# Patient Record
Sex: Female | Born: 1995 | Race: White | Hispanic: No | Marital: Single | Smoking: Never smoker
Health system: Southern US, Community
[De-identification: ages and names within clinical notes are randomized; demographics above are authoritative.]

---
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
## 2013-02-15 ENCOUNTER — Encounter (INDEPENDENT_AMBULATORY_CARE_PROVIDER_SITE_OTHER): Payer: Self-pay

## 2013-02-15 ENCOUNTER — Ambulatory Visit (INDEPENDENT_AMBULATORY_CARE_PROVIDER_SITE_OTHER): Payer: BC Managed Care – PPO

## 2013-02-15 DIAGNOSIS — Z23 Encounter for immunization: Secondary | ICD-10-CM

## 2014-01-05 ENCOUNTER — Ambulatory Visit (INDEPENDENT_AMBULATORY_CARE_PROVIDER_SITE_OTHER): Payer: BC Managed Care – PPO | Admitting: Family

## 2014-01-05 VITALS — BP 109/73 | HR 111 | Temp 98.8°F | Wt 114.2 lb

## 2014-01-05 DIAGNOSIS — L259 Unspecified contact dermatitis, unspecified cause: Secondary | ICD-10-CM

## 2014-01-05 NOTE — Progress Notes (Signed)
   Subjective:    Patient ID: Joan Payne, female    DOB: [birth year], 18 y.o.   MRN: [id number]  Rash This is a new problem. The current episode started today. The problem has been rapidly improving since onset. The affected locations include the left upper leg. The rash is characterized by redness. She was exposed to nothing. Pertinent negatives include no congestion, diarrhea, eye pain, fever, rhinorrhea, shortness of breath or sore throat. Past treatments include anti-itch cream. The treatment provided moderate relief.      Review of Systems  Constitutional: Negative.  Negative for fever.  HENT: Negative.  Negative for congestion, rhinorrhea and sore throat.   Eyes: Negative.  Negative for pain.  Respiratory: Negative.  Negative for shortness of breath.   Cardiovascular: Negative.  Negative for palpitations.  Gastrointestinal: Negative.  Negative for diarrhea.  Endocrine: Negative.   Genitourinary: Negative.   Musculoskeletal: Negative.   Skin: Positive for rash.  Neurological: Negative.  Negative for headaches.  Hematological: Negative.   Psychiatric/Behavioral: Negative.   All other systems reviewed and are negative.      Objective:   Physical Exam  Vitals reviewed. Constitutional: She is oriented to person, place, and time. She appears well-developed and well-nourished. No distress.  Eyes: Pupils are equal, round, and reactive to light.  Neck: Normal range of motion. Neck supple. No thyromegaly present.  Cardiovascular: Normal rate, regular rhythm, normal heart sounds and intact distal pulses.   No murmur heard. Pulmonary/Chest: Effort normal and breath sounds normal. No respiratory distress. She has no wheezes.  Abdominal: Soft. Bowel sounds are normal. She exhibits no distension. There is no tenderness.  Musculoskeletal: Normal range of motion. She exhibits no edema and no tenderness.  Neurological: She is alert and oriented to person, place, and time. She has normal  reflexes. No cranial nerve deficit.  Skin: Skin is warm and dry. Rash noted.  Three different abrasions on upper left leg  Psychiatric: She has a normal mood and affect. Her behavior is normal. Judgment and thought content normal.    BP 109/73  Pulse 111  Temp(Src) 98.8 F (37.1 C) (Oral)  Wt 114 lb 3.2 oz (51.801 kg)       Assessment & Plan:  1. Contact dermatitis -Continue to watch if symptoms  worsen told pt to call and would call in RX for kenalog cream -Do not scratch area -Keep clean and dry  Evelina Dun, FNP

## 2014-01-05 NOTE — Patient Instructions (Signed)

## 2014-02-06 ENCOUNTER — Ambulatory Visit: Payer: BC Managed Care – PPO

## 2014-02-28 ENCOUNTER — Ambulatory Visit: Payer: BC Managed Care – PPO

## 2014-08-07 ENCOUNTER — Encounter: Payer: Self-pay | Admitting: Physician Assistant

## 2014-08-07 ENCOUNTER — Ambulatory Visit (INDEPENDENT_AMBULATORY_CARE_PROVIDER_SITE_OTHER): Payer: BC Managed Care – PPO | Admitting: Physician Assistant

## 2014-08-07 VITALS — BP 117/79 | HR 83 | Temp 98.3°F | Ht 67.0 in | Wt 112.0 lb

## 2014-08-07 DIAGNOSIS — B079 Viral wart, unspecified: Secondary | ICD-10-CM | POA: Diagnosis not present

## 2014-08-07 NOTE — Progress Notes (Signed)
   Subjective:    Patient ID: Joan Payne, female    DOB: 1996/03/06, 19 y.o.   MRN: [id number]  HPI 19 y/o female presents for wart removal on LLE, buttock area.   Review of Systems  Skin:       Wart on LLE       Objective:   Physical Exam  Constitutional: She is oriented to person, place, and time.  Neurological: She is alert and oriented to person, place, and time.  Skin:  Hyperkeratotic flesh colored lesion on lower left buttock, LLE area. Resembling wart. 1cm x .5cm           Assessment & Plan:  1. Wart: biopsy to r/o dysplasia via wider shave excision. Hyfrecation applied post removal. Bandage applied. Advised patient to use vaseline for healing. F/U prn

## 2014-08-09 ENCOUNTER — Telehealth: Payer: Self-pay | Admitting: Physician Assistant

## 2014-08-09 NOTE — Telephone Encounter (Signed)
I strongly advised against tanning d/t effects on skin. However, no restrictions to tanning based on removal and is up to her discretion

## 2014-08-09 NOTE — Telephone Encounter (Signed)
Discussed with Marline Backbone, PA-C. Explained to patient that Tiffany can't advise her to use a tanning bed at all. I did suggest that if she insisted then she should wait until the area is completely healed.

## 2014-12-12 ENCOUNTER — Telehealth: Payer: Self-pay | Admitting: Family

## 2014-12-12 NOTE — Telephone Encounter (Signed)
Patient could benefit from second Menningicoccal injection.

## 2014-12-18 ENCOUNTER — Encounter (INDEPENDENT_AMBULATORY_CARE_PROVIDER_SITE_OTHER): Payer: Self-pay

## 2014-12-18 ENCOUNTER — Ambulatory Visit (INDEPENDENT_AMBULATORY_CARE_PROVIDER_SITE_OTHER): Payer: BC Managed Care – PPO | Admitting: *Deleted

## 2014-12-18 ENCOUNTER — Telehealth: Payer: Self-pay | Admitting: Family Medicine

## 2014-12-18 DIAGNOSIS — Z23 Encounter for immunization: Secondary | ICD-10-CM

## 2014-12-18 NOTE — Telephone Encounter (Signed)
Pt went to a pharmacy to get meningitis but they stated that with her ins should would need to get this from her providers office. Appt w/ the nurse has been made

## 2014-12-18 NOTE — Patient Instructions (Signed)
Meningococcal Vaccines: What You Need to Know 1. What is meningococcal disease? Meningococcal disease is a serious bacterial illness. It is a leading cause of bacterial meningitis in children 2 through 21 years old in the [place]. Meningitis is an infection of the covering of the brain and the spinal cord. Meningococcal disease also causes blood infections. About 1,000-1,200 people get meningococcal disease each year in the [place] Even when they are treated with antibiotics, 10-15% of these people die. Of those who live, another 11%-19% lose their arms or legs, have problems with their nervous systems, become deaf, or suffer seizures or strokes. Anyone can get meningococcal disease. But it is most common in infants less than one year of age and people 16-21 years. Children with certain medical conditions, such as lack of a spleen, have an increased risk of getting meningococcal disease. College freshmen living in [place] are also at increased risk. Meningococcal infections can be treated with drugs such as penicillin. Still, many people who get the disease die from it, and many others are affected for life. This is why preventing the disease through use of meningococcal vaccine is important for people at highest risk. 2. Meningococcal vaccine There are two kinds of meningococcal vaccine in the [place]  Meningococcal conjugate vaccine (MCV4) is the preferred vaccine for people 46 years of age and younger.  Meningococcal polysaccharide vaccine (MPSV4) has been available since the 1970s. It is the only meningococcal vaccine licensed for people older than 37. Both vaccines can prevent 4 types of meningococcal disease, including 2 of the 3 types most common in the [place] and a type that causes epidemics in [place]. There are other types of meningococcal disease; the vaccines do not protect against these.  3. Who should get meningococcal vaccine and when? Routine vaccination Two doses of MCV4 are  recommended for adolescents 11 through 21 years of age: the first dose at 28 or 21 years of age, with a booster dose at age 70. Adolescents in this age group with HIV infection should get 3 doses: 2 doses 2 months apart at 56 or 12 years, plus a booster at age 10. If the first dose (or series) is given between 50 and 19 years of age, the booster should be given between 65 and 77. If the first dose (or series) is given after the 16th birthday, a booster is not needed. Other people at increased risk  College freshmen living in dormitories.  Laboratory personnel who are routinely exposed to meningococcal bacteria.  [place] recruits.  Anyone traveling to, or living in, a part of the world where meningococcal disease is common, such as parts of [place].  Anyone who has a damaged spleen, or whose spleen has been removed.  Anyone who has persistent complement component deficiency (an immune system disorder).  People who might have been exposed to meningitis during an outbreak. Children between 81 and 51 months of age, and anyone else with certain medical conditions need 2 doses for adequate protection. Ask your doctor about the number and timing of doses, and the need for booster doses. MCV4 is the preferred vaccine for people in these groups who are 9 months through 21 years of age. MPSV4 can be used for adults older than 55. 4. Some people should not get meningococcal vaccine or should wait.  Anyone who has ever had a severe (life-threatening) allergic reaction to a previous dose of MCV4 or MPSV4 vaccine should not get another dose of either vaccine.  Anyone who  has a severe (life threatening) allergy to any vaccine component should not get the vaccine. Tell your doctor if you have any severe allergies.  Anyone who is moderately or severely ill at the time the shot is scheduled should probably wait until they recover. Ask your doctor. People with a mild illness can usually get the  vaccine.  Meningococcal vaccines may be given to pregnant women. MCV4 is a fairly new vaccine and has not been studied in pregnant women as much as MPSV4 has. It should be used only if clearly needed. The manufacturers of MCV4 maintain pregnancy registries for women who are vaccinated while pregnant. Except for children with sickle cell disease or without a working spleen, meningococcal vaccines may be given at the same time as other vaccines. 5. What are the risks from meningococcal vaccines? A vaccine, like any medicine, could possibly cause serious problems, such as severe allergic reactions. The risk of meningococcal vaccine causing serious harm, or death, is extremely small. Brief fainting spells and related symptoms (such as jerking or seizure-like movements) can follow a vaccination. They happen most often with adolescents, and they can result in falls and injuries. Sitting or lying down for about 15 minutes after getting the shot--especially if you feel faint--can help prevent these injuries. Mild problems As many as half the people who get meningococcal vaccines have mild side effects, such as redness or pain where the shot was given. If these problems occur, they usually last for 1 or 2 days. They are more common after MCV4 than after MPSV4. A small percentage of people who receive the vaccine develop a mild fever. Severe problems Serious allergic reactions, within a few minutes to a few hours of the shot, are very rare. 6. What if there is a serious reaction? What should I look for? Look for anything that concerns you, such as signs of a severe allergic reaction, very high fever, or behavior changes. Signs of a severe allergic reaction can include hives, swelling of the face and throat, difficulty breathing, a fast heartbeat, dizziness, and weakness. These would start a few minutes to a few hours after the vaccination. What should I do?  If you think it is a severe allergic reaction or  other emergency that can't wait, call 9-1-1 or get the person to the nearest hospital. Otherwise, call your doctor.  Afterward, the reaction should be reported to the Vaccine Adverse Event Reporting System (VAERS). Your doctor might file this report, or you can do it yourself through the VAERS web site at www.vaers.SamedayNews.es, or by calling 573-191-9058. VAERS is only for reporting reactions. They do not give medical advice. 7. The National Vaccine Injury Compensation Program The [employer] Vaccine Injury Compensation Program (VICP) is a federal program that was created to compensate people who may have been injured by certain vaccines. Persons who believe they may have been injured by a vaccine can learn about the program and about filing a claim by calling (435)365-9970 or visiting the [place] website at GoldCloset.com.ee. 8. How can I learn more?  Ask your doctor.  Call your local or state health department.  Contact the Centers for Disease Control and Prevention (CDC):  Call (865) 744-0319 (1-800-CDC-INFO) or  Visit the CDC's website at http://hunter.com/ CDC Meningococcal Vaccine (Interim) VIS (02/21/2010) Document Released: 02/22/2006 Document Revised: 09/11/2013 Document Reviewed: 08/17/2012 [facility] Patient Information 2015 [place]. This information is not intended to replace advice given to you by your health care provider. Make sure you discuss any questions you have  with your health care provider.  

## 2014-12-18 NOTE — Progress Notes (Signed)
menveo given and patient tolerated well.

## 2015-03-27 ENCOUNTER — Encounter: Payer: Self-pay | Admitting: Pediatrics

## 2015-03-27 ENCOUNTER — Ambulatory Visit (INDEPENDENT_AMBULATORY_CARE_PROVIDER_SITE_OTHER): Payer: BC Managed Care – PPO | Admitting: Pediatrics

## 2015-03-27 VITALS — BP 100/66 | HR 87 | Temp 97.0°F | Ht 67.0 in | Wt 117.0 lb

## 2015-03-27 DIAGNOSIS — N390 Urinary tract infection, site not specified: Secondary | ICD-10-CM | POA: Diagnosis not present

## 2015-03-27 DIAGNOSIS — R300 Dysuria: Secondary | ICD-10-CM

## 2015-03-27 DIAGNOSIS — R809 Proteinuria, unspecified: Secondary | ICD-10-CM | POA: Diagnosis not present

## 2015-03-27 DIAGNOSIS — R8281 Pyuria: Secondary | ICD-10-CM

## 2015-03-27 DIAGNOSIS — N10 Acute tubulo-interstitial nephritis: Secondary | ICD-10-CM | POA: Diagnosis not present

## 2015-03-27 LAB — POCT URINALYSIS DIPSTICK
Glucose, UA: NEGATIVE
KETONES UA: NEGATIVE
Nitrite, UA: NEGATIVE
UROBILINOGEN UA: NEGATIVE
pH, UA: 6

## 2015-03-27 LAB — POCT UA - MICROSCOPIC ONLY
Casts, Ur, LPF, POC: NEGATIVE
Crystals, Ur, HPF, POC: NEGATIVE
Mucus, UA: NEGATIVE
YEAST UA: NEGATIVE

## 2015-03-27 LAB — POCT URINE PREGNANCY: Preg Test, Ur: NEGATIVE

## 2015-03-27 MED ORDER — SULFAMETHOXAZOLE-TRIMETHOPRIM 800-160 MG PO TABS: 1.0000 | ORAL_TABLET | Freq: Two times a day (BID) | ORAL | Status: DC

## 2015-03-27 NOTE — Progress Notes (Signed)
    Subjective:    Patient ID: Joan Payne, female    DOB: March 10, 1996, 19 y.o.   MRN: [id number]  CC: painful urination  HPI: Joan Payne is a 19 y.o. female presenting on 03/27/2015 for Dysuria and Hematuria  Had lower back pain Feeling sick to her stomach Started noticing blood in her urine Felt bad starting 4 days ago +dysuria starting a few days ago Not tried anything at home Has had UTi before, been a while Last tested for STIs a few weeks ago after had lesion likely from ingrown hair from shaving, was negative Drinking lots Appetite down Feeling hot and cold, some nausea, one episode of vomiting   Relevant past medical, surgical, family and social history reviewed and updated as indicated. Interim medical history since our last visit reviewed. Allergies and medications reviewed and updated.   ROS: Per HPI unless specifically indicated above  Past Medical History There are no active problems to display for this patient.   Current Outpatient Prescriptions  Medication Sig Dispense Refill  . norethindrone-ethinyl estradiol (BALZIVA) 0.4-35 MG-MCG tablet Take 1 tablet by mouth daily.    Marland Kitchen sulfamethoxazole-trimethoprim (BACTRIM DS) 800-160 MG tablet Take 1 tablet by mouth 2 (two) times daily. 28 tablet 0   No current facility-administered medications for this visit.       Objective:    BP 100/66 mmHg  Pulse 87  Temp(Src) 97 F (36.1 C) (Oral)  Ht 5\' 7"  (1.702 m)  Wt 117 lb (53.071 kg)  BMI 18.32 kg/m2  Wt Readings from Last 3 Encounters:  03/27/15 117 lb (53.071 kg)  08/07/14 112 lb (50.803 kg)  01/05/14 114 lb 3.2 oz (51.801 kg)    Gen: NAD, tired appearing, alert, cooperative with exam, NCAT EYES: EOMI, no scleral injection or icterus ENT:  TMs pearly gray b/l, OP without erythema LYMPH: no cervical LAD CV: NRRR, normal S1/S2, no murmur, distal pulses 2+ b/l Resp: CTABL, no wheezes, normal WOB Abd: +BS, soft, mild suprapubic tenderness, ND. +L CVA  tenderness Ext: No edema, warm Neuro: Alert and oriented, strength equal b/l UE and LE, coordination grossly normal MSK: normal muscle bulk     Assessment & Plan:   [name] was seen today for dysuria and hematuria, +CVA tenderness, subjective fevers, +systemic symptoms. Will treat for 14 days for pyelonephritis, return precaustions given, sending urine culture. u preg negative.  Diagnoses and all orders for this visit:  Dysuria -     POCT UA - Microscopic Only -     POCT urinalysis dipstick -     Urine culture -     POCT urine pregnancy--negative  Pyuria -     sulfamethoxazole-trimethoprim (BACTRIM DS) 800-160 MG tablet; Take 1 tablet by mouth 2 (two) times daily.   Follow up plan: Return if symptoms worsen or fail to improve.  Assunta Found, MD [facility] 03/27/2015, 10:19 AM

## 2015-03-28 ENCOUNTER — Ambulatory Visit: Payer: BC Managed Care – PPO | Admitting: Family

## 2015-03-28 DIAGNOSIS — R809 Proteinuria, unspecified: Secondary | ICD-10-CM | POA: Insufficient documentation

## 2015-03-28 NOTE — Addendum Note (Signed)
Addended by: Eustaquio Maize on: 03/28/2015 09:27 AM   Modules accepted: Miquel Dunn

## 2015-03-29 LAB — URINE CULTURE

## 2015-03-29 MED ORDER — CIPROFLOXACIN HCL 500 MG PO TABS: 500.0000 mg | ORAL_TABLET | Freq: Two times a day (BID) | ORAL | Status: AC

## 2015-03-29 NOTE — Addendum Note (Signed)
Addended by: Eustaquio Maize on: 03/29/2015 02:03 PM   Modules accepted: Orders, Medications, SmartSet

## 2015-04-15 ENCOUNTER — Telehealth: Payer: Self-pay | Admitting: Family

## 2015-04-15 NOTE — Telephone Encounter (Signed)
Got flu shot at [facility] in october

## 2016-02-14 ENCOUNTER — Ambulatory Visit: Payer: BC Managed Care – PPO

## 2016-03-31 ENCOUNTER — Ambulatory Visit (INDEPENDENT_AMBULATORY_CARE_PROVIDER_SITE_OTHER): Payer: BC Managed Care – PPO | Admitting: Family

## 2016-03-31 ENCOUNTER — Encounter: Payer: Self-pay | Admitting: Family

## 2016-03-31 VITALS — BP 109/70 | HR 91 | Temp 97.0°F | Ht 66.5 in | Wt 117.0 lb

## 2016-03-31 DIAGNOSIS — Z0000 Encounter for general adult medical examination without abnormal findings: Secondary | ICD-10-CM | POA: Diagnosis not present

## 2016-03-31 DIAGNOSIS — Z111 Encounter for screening for respiratory tuberculosis: Secondary | ICD-10-CM

## 2016-03-31 DIAGNOSIS — K5900 Constipation, unspecified: Secondary | ICD-10-CM | POA: Insufficient documentation

## 2016-03-31 MED ORDER — LINACLOTIDE 72 MCG PO CAPS: 72.0000 ug | ORAL_CAPSULE | Freq: Every day | ORAL | 6 refills | Status: DC

## 2016-03-31 NOTE — Patient Instructions (Signed)

## 2016-03-31 NOTE — Progress Notes (Signed)
   Subjective:    Patient ID: Joan Payne, female    DOB: 19-Jul-1995, 20 y.o.   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 PT currently taking OC. Pt denies any headache, palpitations, SOB, or edema at this time.  Constipation  This is a chronic problem. The current episode started more than 1 year ago. The problem has been waxing and waning since onset. Her stool frequency is 1 time per week or less. The patient is on a high fiber diet. She exercises regularly. There has not been adequate water intake. Associated symptoms include abdominal pain. Pertinent negatives include no back pain or vomiting. She has tried stool softeners for the symptoms. The treatment provided no relief.      Review of Systems  Gastrointestinal: Positive for abdominal pain and constipation. Negative for vomiting.  Musculoskeletal: Negative for back pain.  All other systems reviewed and are negative.      Objective:   Physical Exam  Constitutional: She is oriented to person, place, and time. She appears well-developed and well-nourished. No distress.  HENT:  Head: Normocephalic and atraumatic.  Right Ear: External ear normal.  Left Ear: External ear normal.  Mouth/Throat: Oropharynx is clear and moist.  Eyes: Pupils are equal, round, and reactive to light.  Neck: Normal range of motion. Neck supple. No thyromegaly present.  Cardiovascular: Normal rate, regular rhythm, normal heart sounds and intact distal pulses.   No murmur heard. Pulmonary/Chest: Effort normal and breath sounds normal. No respiratory distress. She has no wheezes.  Abdominal: Soft. Bowel sounds are normal. She exhibits no distension. There is no tenderness.  Musculoskeletal: Normal range of motion. She exhibits no edema or tenderness.  Neurological: She is alert and oriented to person, place, and time.  Skin: Skin is warm and dry.  Psychiatric: She has a normal mood and  affect. Her behavior is normal. Judgment and thought content normal.  Vitals reviewed.     BP 109/70   Pulse 91   Temp 97 F (36.1 C) (Oral)   Ht 5' 6.5" (1.689 m)   Wt 117 lb (53.1 kg)   BMI 18.60 kg/m      Assessment & Plan:  1. Screening-pulmonary TB - TB Skin Test  2. Annual physical exam  3. Constipation, unspecified constipation type -PT started on linzess today -Force fluids - linaclotide (LINZESS) 72 MCG capsule; Take 1 capsule (72 mcg total) by mouth daily before breakfast.  Dispense: 30 capsule; Refill: 6   Continue all meds Labs pending Health Maintenance reviewed Diet and exercise encouraged RTO 6  Evelina Dun, FNP

## 2016-04-03 ENCOUNTER — Telehealth: Payer: Self-pay | Admitting: Family

## 2016-04-03 LAB — TB SKIN TEST
INDURATION: 0 mm
TB SKIN TEST: NEGATIVE

## 2016-04-03 NOTE — Telephone Encounter (Signed)
Pt will bring form in for correction

## 2016-04-06 ENCOUNTER — Telehealth: Payer: Self-pay | Admitting: Family

## 2016-04-06 NOTE — Telephone Encounter (Signed)
Form has been fixed and left up front for dad to pick up.

## 2016-04-16 ENCOUNTER — Other Ambulatory Visit: Payer: Self-pay | Admitting: Family

## 2016-10-14 ENCOUNTER — Encounter: Payer: Self-pay | Admitting: Family Medicine

## 2016-10-14 ENCOUNTER — Ambulatory Visit (INDEPENDENT_AMBULATORY_CARE_PROVIDER_SITE_OTHER): Payer: BC Managed Care – PPO | Admitting: Family Medicine

## 2016-10-14 VITALS — BP 109/81 | HR 84 | Temp 97.6°F | Ht 66.5 in | Wt 123.0 lb

## 2016-10-14 DIAGNOSIS — D210 Benign neoplasm of connective and other soft tissue of head, face and neck: Secondary | ICD-10-CM | POA: Diagnosis not present

## 2016-10-14 DIAGNOSIS — M76892 Other specified enthesopathies of left lower limb, excluding foot: Secondary | ICD-10-CM

## 2016-10-14 MED ORDER — MELOXICAM 15 MG PO TABS: 15.0000 mg | ORAL_TABLET | Freq: Every day | ORAL | 5 refills | Status: DC

## 2016-10-14 NOTE — Progress Notes (Signed)
Chief Complaint  Patient presents with  . Skin Problem    pt here today c/o "place on nose" that's getting more noticeable and also c/o left knee "popping" only when going to the gym doing squats or running.    HPI  Patient presents today for Knee pain present for about 4 months. First noticed when she tried to start working out at the gym 4 months ago. She kept going to the gym and pain was so bad she finally quit about 2 months ago. Lunges and other squat style exercises intensified the pain. It is okay at rest. Exacerbated with moderate pain with activities other than just walking. The lesion on the nose is a little bit bigger than when she first noticed it a couple of years ago. It's beginning to be raised up slightly  PMH: Smoking status noted ROS: Per HPI  Objective: BP 109/81   Pulse 84   Temp 97.6 F (36.4 C) (Oral)   Ht 5' 6.5" (1.689 m)   Wt 123 lb (55.8 kg)   BMI 19.56 kg/m  Gen: NAD, alert, cooperative with exam HEENT: NCAT, EOMI, PERRLThere is a 3 mm fibrous lesion at the base of the nose on the right. CV: RRR, good S1/S2, no murmur Resp: CTABL, no wheezes, non-labored Abd: SNTND, BS present, no guarding or organomegaly Ext: No edema, warm The left  knee has full range of motion without discomfort actively and passively. Gait is normal without a limp. The joint lines are tender medially only. The patella is palpable without tenderness or edema.  Lachman / anterior drawer signs are negative for signs of instability and pain free. McMurray testing reveals no pop or excessive discomfort. Varus and valgus stree maneuvers do not cause ligamentous stretch or instability.   Neuro: Alert and oriented, No gross deficits  Assessment and plan:  1. Fibroma of nose   2. Tendinitis of left knee     Meds ordered this encounter  Medications  . meloxicam (MOBIC) 15 MG tablet    Sig: Take 1 tablet (15 mg total) by mouth daily. For joint and muscle pain    Dispense:  30 tablet    Refill:  5    Avoid other NSAIDs due to the use of meloxicam. Wear the brace at all times except when laying down and resting or bathing.  Follow up as needed.  Claretta Fraise, MD

## 2016-10-14 NOTE — Patient Instructions (Addendum)
Avoid other NSAIDs due to the use of meloxicam. Wear the brace at all times except when laying down and resting or bathing. Apply ice to the surface of the knee 15 minutes at a time up to 4 times a day as needed for the pain.  It was a pleasure to see you today thanks for coming in.  Claretta Fraise M.D.

## 2016-12-08 ENCOUNTER — Ambulatory Visit: Payer: BC Managed Care – PPO | Admitting: Physician Assistant

## 2016-12-09 ENCOUNTER — Ambulatory Visit (INDEPENDENT_AMBULATORY_CARE_PROVIDER_SITE_OTHER): Payer: BC Managed Care – PPO | Admitting: Family Medicine

## 2016-12-09 ENCOUNTER — Encounter: Payer: Self-pay | Admitting: Family Medicine

## 2016-12-09 VITALS — BP 110/77 | HR 78 | Temp 97.3°F | Ht 66.5 in | Wt 123.0 lb

## 2016-12-09 DIAGNOSIS — R42 Dizziness and giddiness: Secondary | ICD-10-CM | POA: Diagnosis not present

## 2016-12-09 DIAGNOSIS — R5383 Other fatigue: Secondary | ICD-10-CM

## 2016-12-09 LAB — PREGNANCY, URINE: Preg Test, Ur: NEGATIVE

## 2016-12-09 NOTE — Progress Notes (Signed)
BP 110/77   Pulse 78   Temp (!) 97.3 F (36.3 C) (Oral)   Ht 5' 6.5" (1.689 m)   Wt 123 lb (55.8 kg)   BMI 19.56 kg/m    Subjective:    Patient ID: Joan Payne, female    DOB: 19-Oct-1995, 21 y.o.   MRN: [id number]  HPI: Joan Payne is a 21 y.o. female presenting on 12/09/2016 for Fatigue (recent episodes of fatigue, reports she eats breakfast every morning but by 9:00 am she is feeling jittery and like she may pass out) and Insect Bite (to right lower leg, was seen and prescribed antibiotics, has one dose left and wants to make sure infection is cleared)   HPI Fatigue and dizziness Patient comes in today complaining of fatigue and dizziness that has been recurring. 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 She is teaching first grade. She says that when she gets to the time. When it's been a little while since she's eaten and before the next meal she'll start to feel jittery and dizzy and feel like she is starting to space out and not able to focus in on the kids are what is going on. She says then she will eat and then feel better. She has been feeling more tired and fatigued recently as well. She says she does sleep at night from about 9:30 PM until 5:30 in the morning so she is getting her 8 hours most of the time. She does admit to having heavy periods, she takes a birth control for this currently and that helps control her periods but she still has very heavy bleeding during her cycles. She uses 3 tampons in a half-day already today. She had some symptoms like this over the past year very occasionally but now she is having this happen every day and almost before every meal. She denies any chest pain or shortness of breath. She does admit that she is sexually active and had been on an antibiotic recently but she is currently on her period right now so she does not think she could be  pregnant.  Relevant past medical, surgical, family and social history reviewed and updated as indicated. Interim medical history since our last visit reviewed. Allergies and medications reviewed and updated.  Review of Systems  Constitutional: Positive for fatigue. Negative for chills and fever.  HENT: Negative for congestion, ear discharge and ear pain.   Eyes: Negative for pain, redness and visual disturbance.  Respiratory: Negative for cough, chest tightness and shortness of breath.   Cardiovascular: Negative for chest pain, palpitations and leg swelling.  Gastrointestinal: Negative for abdominal pain, blood in stool, diarrhea, nausea and vomiting.  Genitourinary: Positive for menstrual problem. Negative for decreased urine volume, difficulty urinating, dysuria, pelvic pain, urgency, vaginal bleeding, vaginal discharge and vaginal pain.  Musculoskeletal: Negative for back pain and gait problem.  Skin: Negative for color change and rash.  Neurological: Positive for dizziness and light-headedness. Negative for syncope, speech difficulty, weakness and headaches.  Psychiatric/Behavioral: Negative for agitation and behavioral problems.  All other systems reviewed and are negative.   Per HPI unless specifically indicated above        Objective:    BP 110/77   Pulse 78   Temp (!) 97.3 F (36.3 C) (Oral)   Ht 5' 6.5" (1.689 m)   Wt 123 lb (55.8 kg)  BMI 19.56 kg/m   Wt Readings from Last 3 Encounters:  12/09/16 123 lb (55.8 kg)  10/14/16 123 lb (55.8 kg)  03/31/16 117 lb (53.1 kg)    Physical Exam  Constitutional: She is oriented to person, place, and time. She appears well-developed and well-nourished. No distress.  Eyes: Conjunctivae are normal.  Neck: Neck supple. No thyromegaly present.  Cardiovascular: Normal rate, regular rhythm, normal heart sounds and intact distal pulses.   No murmur heard. Pulmonary/Chest: Effort normal and breath sounds normal. No respiratory  distress. She has no wheezes. She has no rales.  Abdominal: Soft. Bowel sounds are normal. She exhibits no distension. There is no tenderness. There is no rebound.  Musculoskeletal: Normal range of motion.  Lymphadenopathy:    She has no cervical adenopathy.  Neurological: She is alert and oriented to person, place, and time. No cranial nerve deficit. She exhibits normal muscle tone. Coordination normal.  Skin: Skin is warm and dry. No rash noted. She is not diaphoretic.  Psychiatric: She has a normal mood and affect. Her behavior is normal.  Nursing note and vitals reviewed.     Assessment & Plan:   Problem List Items Addressed This Visit    None    Visit Diagnoses    Other fatigue    -  Primary   Relevant Orders   Pregnancy, urine (Completed)   CBC with Differential/Platelet   CMP14+EGFR   Thyroid Panel With TSH   Dizziness       Relevant Orders   Pregnancy, urine (Completed)   CBC with Differential/Platelet   CMP14+EGFR   Thyroid Panel With TSH       Follow up plan: Return if symptoms worsen or fail to improve.  Counseling provided for all of the vaccine components Orders Placed This Encounter  Procedures  . Pregnancy, urine  . CBC with Differential/Platelet  . CMP14+EGFR  . Thyroid Panel With TSH    Caryl Pina, MD [facility] 12/09/2016, 4:06 PM

## 2016-12-10 LAB — CBC WITH DIFFERENTIAL/PLATELET
BASOS: 0 %
Basophils Absolute: 0 10*3/uL (ref 0.0–0.2)
EOS (ABSOLUTE): 0.1 10*3/uL (ref 0.0–0.4)
EOS: 1 %
HEMATOCRIT: 38.7 % (ref 34.0–46.6)
HEMOGLOBIN: 13.3 g/dL (ref 11.1–15.9)
IMMATURE GRANS (ABS): 0 10*3/uL (ref 0.0–0.1)
Immature Granulocytes: 0 %
LYMPHS ABS: 2.1 10*3/uL (ref 0.7–3.1)
LYMPHS: 42 %
MCH: 32 pg (ref 26.6–33.0)
MCHC: 34.4 g/dL (ref 31.5–35.7)
MCV: 93 fL (ref 79–97)
MONOCYTES: 6 %
Monocytes Absolute: 0.3 10*3/uL (ref 0.1–0.9)
NEUTROS ABS: 2.5 10*3/uL (ref 1.4–7.0)
Neutrophils: 51 %
Platelets: 312 10*3/uL (ref 150–379)
RBC: 4.16 x10E6/uL (ref 3.77–5.28)
RDW: 13.2 % (ref 12.3–15.4)
WBC: 4.9 10*3/uL (ref 3.4–10.8)

## 2016-12-10 LAB — CMP14+EGFR
ALBUMIN: 4.3 g/dL (ref 3.5–5.5)
ALK PHOS: 50 IU/L (ref 39–117)
ALT: 7 IU/L (ref 0–32)
AST: 15 IU/L (ref 0–40)
Albumin/Globulin Ratio: 1.5 (ref 1.2–2.2)
BILIRUBIN TOTAL: 0.2 mg/dL (ref 0.0–1.2)
BUN / CREAT RATIO: 9 (ref 9–23)
BUN: 7 mg/dL (ref 6–20)
CHLORIDE: 102 mmol/L (ref 96–106)
CO2: 19 mmol/L — ABNORMAL LOW (ref 20–29)
Calcium: 9.1 mg/dL (ref 8.7–10.2)
Creatinine, Ser: 0.82 mg/dL (ref 0.57–1.00)
GFR calc non Af Amer: 101 mL/min/{1.73_m2} (ref >59)
GFR, EST AFRICAN AMERICAN: 117 mL/min/{1.73_m2} (ref >59)
GLOBULIN, TOTAL: 2.9 g/dL (ref 1.5–4.5)
Glucose: 88 mg/dL (ref 65–99)
Potassium: 4.4 mmol/L (ref 3.5–5.2)
SODIUM: 137 mmol/L (ref 134–144)
TOTAL PROTEIN: 7.2 g/dL (ref 6.0–8.5)

## 2016-12-10 LAB — THYROID PANEL WITH TSH
Free Thyroxine Index: 2 (ref 1.2–4.9)
T3 UPTAKE RATIO: 19 % — AB (ref 24–39)
T4, Total: 10.6 ug/dL (ref 4.5–12.0)
TSH: 2.23 u[IU]/mL (ref 0.450–4.500)

## 2016-12-30 ENCOUNTER — Encounter: Payer: Self-pay | Admitting: Family Medicine

## 2016-12-30 ENCOUNTER — Ambulatory Visit (INDEPENDENT_AMBULATORY_CARE_PROVIDER_SITE_OTHER): Payer: BC Managed Care – PPO | Admitting: Family Medicine

## 2016-12-30 VITALS — BP 114/81 | HR 74 | Temp 97.4°F | Ht 66.5 in | Wt 124.5 lb

## 2016-12-30 DIAGNOSIS — E162 Hypoglycemia, unspecified: Secondary | ICD-10-CM | POA: Diagnosis not present

## 2016-12-30 DIAGNOSIS — R42 Dizziness and giddiness: Secondary | ICD-10-CM

## 2016-12-30 DIAGNOSIS — R809 Proteinuria, unspecified: Secondary | ICD-10-CM

## 2016-12-30 LAB — URINALYSIS
BILIRUBIN UA: NEGATIVE
GLUCOSE, UA: NEGATIVE
Leukocytes, UA: NEGATIVE
Nitrite, UA: NEGATIVE
PH UA: 6 (ref 5.0–7.5)
Protein, UA: NEGATIVE
Specific Gravity, UA: >1.03 — ABNORMAL HIGH (ref 1.005–1.030)
UUROB: 1 mg/dL (ref 0.2–1.0)

## 2016-12-30 MED ORDER — BLOOD GLUCOSE TEST VI STRP: 1.0000 | ORAL_STRIP | Freq: Two times a day (BID) | 11 refills | Status: DC

## 2016-12-30 MED ORDER — BLOOD GLUCOSE MONITOR KIT: PACK | 0 refills | Status: DC

## 2016-12-30 NOTE — Progress Notes (Signed)
BP 114/81   Pulse 74   Temp (!) 97.4 F (36.3 C) (Oral)   Ht 5' 6.5" (1.689 m)   Wt 124 lb 8 oz (56.5 kg)   BMI 19.79 kg/m    Subjective:    Patient ID: Joan Payne, female    DOB: [birth year], 21 y.o.   MRN: [id number]  HPI: Joan Payne is a 21 y.o. female presenting on 12/30/2016 for Fatigue and dizziness followup (still having episodes, snacks between meals to try to decrease episodes)   HPI Fatigue and dizziness and history of proteinuria Patient has still been having some episodes of fatigue and dizziness. She has been able to keep the mostly under control by having some snacks between meals and has been decreasing the episodes. History of proteinuria we will recheck that today. She denies any chest pain or palpitations or shortness of breath or wheezing. The episodes lasted no more than a few minutes and then passed but she does feel lightheaded.  Relevant past medical, surgical, family and social history reviewed and updated as indicated. Interim medical history since our last visit reviewed. Allergies and medications reviewed and updated.  Review of Systems  Constitutional: Positive for fatigue. Negative for chills and fever.  HENT: Negative for congestion, ear discharge and ear pain.   Eyes: Negative for redness and visual disturbance.  Respiratory: Negative for cough, chest tightness, shortness of breath and wheezing.   Cardiovascular: Negative for chest pain, palpitations and leg swelling.  Genitourinary: Negative for difficulty urinating, dysuria and frequency.  Musculoskeletal: Negative for back pain and gait problem.  Skin: Negative for rash.  Neurological: Positive for dizziness. Negative for light-headedness and headaches.  Psychiatric/Behavioral: Negative for agitation and behavioral problems.  All other systems reviewed and are negative.   Per HPI unless specifically indicated above     Objective:    BP 114/81   Pulse 74   Temp (!) 97.4 F (36.3  C) (Oral)   Ht 5' 6.5" (1.689 m)   Wt 124 lb 8 oz (56.5 kg)   BMI 19.79 kg/m   Wt Readings from Last 3 Encounters:  12/30/16 124 lb 8 oz (56.5 kg)  12/09/16 123 lb (55.8 kg)  10/14/16 123 lb (55.8 kg)    Physical Exam  Constitutional: She is oriented to person, place, and time. She appears well-developed and well-nourished. No distress.  Eyes: Conjunctivae are normal.  Neck: Neck supple. No thyromegaly present.  Cardiovascular: Normal rate, regular rhythm, normal heart sounds and intact distal pulses.   No murmur heard. Pulmonary/Chest: Effort normal and breath sounds normal. No respiratory distress. She has no wheezes. She has no rales.  Musculoskeletal: Normal range of motion.  Lymphadenopathy:    She has no cervical adenopathy.  Neurological: She is alert and oriented to person, place, and time. Coordination normal.  Skin: Skin is warm and dry. No rash noted. She is not diaphoretic.  Psychiatric: She has a normal mood and affect. Her behavior is normal.  Nursing note and vitals reviewed.   Urinalysis: Negative for protein, trace blood, trace ketones    Assessment & Plan:   Problem List Items Addressed This Visit      Other   Proteinuria   Relevant Orders   Urinalysis, Complete    Other Visit Diagnoses    Hypoglycemia    -  Primary   Relevant Medications   Glucose Blood (BLOOD GLUCOSE TEST STRIPS) STRP   blood glucose meter kit and supplies KIT   Lightheadedness  Relevant Medications   Glucose Blood (BLOOD GLUCOSE TEST STRIPS) STRP   blood glucose meter kit and supplies KIT   Other Relevant Orders   Ambulatory referral to ENT   Dizziness       Relevant Orders   Ambulatory referral to ENT      Looks like patient was slightly dehydrated on her urine, recommended hydration and keeping a snack with her, will go to ENT referral. Follow up plan: Return if symptoms worsen or fail to improve.  Counseling provided for all of the vaccine components Orders  Placed This Encounter  Procedures  . Urinalysis, Complete  . Ambulatory referral to ENT     , MD [facility] 12/30/2016, 4:21 PM     

## 2017-01-07 ENCOUNTER — Telehealth: Payer: Self-pay

## 2017-01-07 DIAGNOSIS — E162 Hypoglycemia, unspecified: Secondary | ICD-10-CM

## 2017-01-07 NOTE — Telephone Encounter (Signed)
I believe it was per endocrinology because of hypoglycemia and there was a specific endocrinologist that they had recommended an endocrinologist that they had wanted to see but I cannot find the old phone message that it came from.

## 2017-01-07 NOTE — Telephone Encounter (Signed)
Pt states she was supposed to be referred to [facility]. She doesn't know the reason or the doctor. A referral has been placed to ENT for dizziness. Please advise

## 2017-01-08 NOTE — Telephone Encounter (Signed)
Lmtcb jkp 8/31

## 2017-01-08 NOTE — Telephone Encounter (Signed)
Patient aware that we are making a referral for her to Dr. Nicoletta Dress, endocrinology, at [facility]

## 2017-01-08 NOTE — Telephone Encounter (Signed)
The email came in from her dad, Naudia Crosley, DOB 01/09/59.  He wanted you to refer her to Dr. Nicoletta Dress at [facility] like a referral was made to ENT instead of him.  I just placed a referral to him instead and will contact patient to let her know.

## 2017-07-15 ENCOUNTER — Ambulatory Visit: Payer: BC Managed Care – PPO

## 2017-12-22 ENCOUNTER — Ambulatory Visit: Payer: BC Managed Care – PPO

## 2018-01-04 ENCOUNTER — Encounter: Payer: BC Managed Care – PPO | Admitting: Family

## 2018-04-27 ENCOUNTER — Encounter: Payer: Self-pay | Admitting: Family Medicine

## 2018-04-27 ENCOUNTER — Ambulatory Visit (INDEPENDENT_AMBULATORY_CARE_PROVIDER_SITE_OTHER): Payer: BC Managed Care – PPO | Admitting: Family Medicine

## 2018-04-27 VITALS — BP 112/78 | HR 106 | Temp 97.4°F | Ht 66.0 in | Wt 125.0 lb

## 2018-04-27 DIAGNOSIS — R509 Fever, unspecified: Secondary | ICD-10-CM

## 2018-04-27 DIAGNOSIS — J111 Influenza due to unidentified influenza virus with other respiratory manifestations: Secondary | ICD-10-CM | POA: Diagnosis not present

## 2018-04-27 LAB — VERITOR FLU A/B WAIVED
INFLUENZA B: NEGATIVE
Influenza A: NEGATIVE

## 2018-04-27 MED ORDER — OSELTAMIVIR PHOSPHATE 75 MG PO CAPS: 75.0000 mg | ORAL_CAPSULE | Freq: Two times a day (BID) | ORAL | 0 refills | Status: AC

## 2018-04-27 MED ORDER — TRIAMCINOLONE ACETONIDE 0.1 % EX CREA: 1.0000 "application " | TOPICAL_CREAM | Freq: Two times a day (BID) | CUTANEOUS | 0 refills | Status: DC

## 2018-04-27 NOTE — Progress Notes (Signed)
Chief Complaint  Patient presents with  . Generalized Body Aches  . Fever  . Cough    HPI  Patient presents today for patient presents with dry cough runny stuffy nose. Diffuse headache of moderate intensity. Patient also has chills and subjective fever. Body aches worst in the back but present in the legs, shoulders, and torso as well. Has sapped the energy to the point that of being unable to perform usual activities other than ADLs. Onset 2 days ago.   PMH: Smoking status noted ROS: Per HPI  Objective: BP 112/78   Pulse (!) 106   Temp (!) 97.4 F (36.3 C) (Oral)   Ht 5\' 6"  (1.676 m)   Wt 125 lb (56.7 kg)   BMI 20.18 kg/m  Gen: NAD, alert, cooperative with exam HEENT: NCAT, EOMI, PERRL CV: RRR, good S1/S2, no murmur Resp: CTABL, no wheezes, non-labored Abd: SNTND, BS present, no guarding or organomegaly Ext: No edema, warm Neuro: Alert and oriented, No gross deficits  Assessment and plan:  1. Fever, unspecified fever cause   2. Influenza with respiratory manifestation     Meds ordered this encounter  Medications  . oseltamivir (TAMIFLU) 75 MG capsule    Sig: Take 1 capsule (75 mg total) by mouth 2 (two) times daily for 5 days.    Dispense:  10 capsule    Refill:  0  . triamcinolone cream (KENALOG) 0.1 %    Sig: Apply 1 application topically 2 (two) times daily.    Dispense:  30 g    Refill:  0    Orders Placed This Encounter  Procedures  . Veritor Flu A/B Waived    Order Specific Question:   Source    Answer:   nasal    Follow up as needed.  Claretta Fraise, MD

## 2018-06-15 ENCOUNTER — Telehealth: Payer: Self-pay | Admitting: Family

## 2019-02-07 ENCOUNTER — Other Ambulatory Visit: Payer: Self-pay

## 2019-02-07 DIAGNOSIS — Z20822 Contact with and (suspected) exposure to covid-19: Secondary | ICD-10-CM

## 2019-02-08 LAB — NOVEL CORONAVIRUS, NAA: SARS-CoV-2, NAA: NOT DETECTED

## 2019-03-28 ENCOUNTER — Encounter: Payer: Self-pay | Admitting: Family

## 2019-03-28 ENCOUNTER — Ambulatory Visit (INDEPENDENT_AMBULATORY_CARE_PROVIDER_SITE_OTHER): Payer: BC Managed Care – PPO | Admitting: Family

## 2019-03-28 DIAGNOSIS — J069 Acute upper respiratory infection, unspecified: Secondary | ICD-10-CM | POA: Diagnosis not present

## 2019-03-28 MED ORDER — FLUTICASONE PROPIONATE 50 MCG/ACT NA SUSP: 2.0000 | Freq: Every day | NASAL | 6 refills | Status: DC

## 2019-03-28 NOTE — Progress Notes (Signed)
   Virtual Visit via telephone Note Due to COVID-19 pandemic this visit was conducted virtually. This visit type was conducted due to national recommendations for restrictions regarding the COVID-19 Pandemic (e.g. social distancing, sheltering in place) in an effort to limit this patient's exposure and mitigate transmission in our community. All issues noted in this document were discussed and addressed.  A physical exam was not performed with this format.  I connected with Joan Payne on 03/28/19 at 12:15 pm by telephone and verified that I am speaking with the correct person using two identifiers. Joan Payne is currently located at home and no one is currently with her during visit. The provider, Evelina Dun, FNP is located in their office at time of visit.  I discussed the limitations, risks, security and privacy concerns of performing an evaluation and management service by telephone and the availability of in person appointments. I also discussed with the patient that there may be a patient responsible charge related to this service. The patient expressed understanding and agreed to proceed.   History and Present Illness:  Sinusitis This is a new problem. The current episode started in the past 7 days. The problem has been waxing and waning since onset. There has been no fever. The pain is mild. Associated symptoms include congestion, coughing and sinus pressure. Pertinent negatives include no chills, ear pain, headaches, shortness of breath, sneezing or sore throat. Past treatments include acetaminophen. The treatment provided mild relief.      Review of Systems  Constitutional: Negative for chills.  HENT: Positive for congestion and sinus pressure. Negative for ear pain, sneezing and sore throat.   Respiratory: Positive for cough. Negative for shortness of breath.   Neurological: Negative for headaches.  All other systems reviewed and are negative.    Observations/Objective:  No SOB or distressed   Assessment and Plan: 1. Viral URI Pt will go get COVID tested tomorrow - Take meds as prescribed - Use a cool mist humidifier  -Use saline nose sprays frequently -Force fluids -For any cough or congestion  Use plain Mucinex- regular strength or max strength is fine -For fever or aces or pains- take tylenol or ibuprofen. -Throat lozenges if help -Call if symptoms worsen or do not  Improve  - fluticasone (FLONASE) 50 MCG/ACT nasal spray; Place 2 sprays into both nostrils daily.  Dispense: 16 g; Refill: 6    I discussed the assessment and treatment plan with the patient. The patient was provided an opportunity to ask questions and all were answered. The patient agreed with the plan and demonstrated an understanding of the instructions.   The patient was advised to call back or seek an in-person evaluation if the symptoms worsen or if the condition fails to improve as anticipated.  The above assessment and management plan was discussed with the patient. The patient verbalized understanding of and has agreed to the management plan. Patient is aware to call the clinic if symptoms persist or worsen. Patient is aware when to return to the clinic for a follow-up visit. Patient educated on when it is appropriate to go to the emergency department.   Time call ended:  12:25 pm  I provided 10 minutes of non-face-to-face time during this encounter.    Evelina Dun, FNP

## 2019-03-29 ENCOUNTER — Other Ambulatory Visit: Payer: Self-pay

## 2019-03-29 DIAGNOSIS — Z20822 Contact with and (suspected) exposure to covid-19: Secondary | ICD-10-CM

## 2019-03-30 LAB — NOVEL CORONAVIRUS, NAA: SARS-CoV-2, NAA: DETECTED — AB

## 2019-04-03 ENCOUNTER — Other Ambulatory Visit: Payer: Self-pay | Admitting: Family

## 2020-01-10 ENCOUNTER — Telehealth: Payer: Self-pay | Admitting: Family Medicine

## 2020-01-10 NOTE — Telephone Encounter (Signed)
Unlikely. Sounds like acid reflux.  Sore arm probably associated with vaccine though.  This is typical of IM vaccines.

## 2020-01-10 NOTE — Telephone Encounter (Signed)
Patient had pfizer vaccine for Covid on Monday around 5p. States today after lunch she has had slight burning in her chest with tightness about hour after she ate chicken and riceorono. Only other symptom was her arm was sore. Do you think this was a side effect of vaccine?

## 2020-01-10 NOTE — Telephone Encounter (Signed)
Lmtcb.

## 2020-01-11 NOTE — Telephone Encounter (Signed)
Pt returned missed call from [place]. Reviewed Dr Alver Sorrow note with pt. Pt voiced understanding and said she is feeling better today. Was just a little worried yesterday. Will call us back if symptoms come back.

## 2020-08-06 ENCOUNTER — Ambulatory Visit: Payer: BC Managed Care – PPO | Admitting: Nurse Practitioner

## 2021-05-02 ENCOUNTER — Telehealth: Payer: BC Managed Care – PPO | Admitting: Family

## 2021-05-02 ENCOUNTER — Encounter: Payer: Self-pay | Admitting: Family

## 2021-05-02 DIAGNOSIS — U071 COVID-19: Secondary | ICD-10-CM

## 2021-05-02 MED ORDER — FLUTICASONE PROPIONATE 50 MCG/ACT NA SUSP: 2.0000 | Freq: Every day | NASAL | 6 refills | Status: AC

## 2021-05-02 MED ORDER — ONDANSETRON HCL 4 MG PO TABS: 4.0000 mg | ORAL_TABLET | Freq: Three times a day (TID) | ORAL | 0 refills | Status: AC | PRN

## 2021-05-02 NOTE — Progress Notes (Signed)
Virtual Visit  Note Due to COVID-19 pandemic this visit was conducted virtually. This visit type was conducted due to national recommendations for restrictions regarding the COVID-19 Pandemic (e.g. social distancing, sheltering in place) in an effort to limit this patient's exposure and mitigate transmission in our community. All issues noted in this document were discussed and addressed.  A physical exam was not performed with this format.  I connected with Joan Payne on 05/02/21 at 1:41 pm  by telephone and verified that I am speaking with the correct person using two identifiers. Joan Payne is currently located at home and her parents is currently with her during visit. The provider, Evelina Dun, FNP is located in their office at time of visit.  I discussed the limitations, risks, security and privacy concerns of performing an evaluation and management service by telephone and the availability of in person appointments. I also discussed with the patient that there may be a patient responsible charge related to this service. The patient expressed understanding and agreed to proceed.  Joan Payne are scheduled for a virtual visit with your provider today.    Just as we do with appointments in the office, we must obtain your consent to participate.  Your consent will be active for this visit and any virtual visit you may have with one of our providers in the next 365 days.    If you have a MyChart account, I can also send a copy of this consent to you electronically.  All virtual visits are billed to your insurance company just like a traditional visit in the office.  As this is a virtual visit, video technology does not allow for your provider to perform a traditional examination.  This may limit your provider's ability to fully assess your condition.  If your provider identifies any concerns that need to be evaluated in person or the need to arrange testing such as labs, EKG, etc, we  will make arrangements to do so.    Although advances in technology are sophisticated, we cannot ensure that it will always work on either your end or our end.  If the connection with a video visit is poor, we may have to switch to a telephone visit.  With either a video or telephone visit, we are not always able to ensure that we have a secure connection.   I need to obtain your verbal consent now.   Are you willing to proceed with your visit today?   Joan Payne has provided verbal consent on 05/02/2021 for a virtual visit (video or telephone).   Evelina Dun, [place] 05/02/2021  1:42 PM   History and Present Illness:  Pt calls the office today with + COVID. She reports her symptoms started on 04/25/21. Cough This is a new problem. The current episode started 1 to 4 weeks ago. The problem has been waxing and waning. The problem occurs every few minutes. The cough is Non-productive. Associated symptoms include headaches and nasal congestion. Pertinent negatives include no chills, ear congestion, ear pain, fever, myalgias, shortness of breath or wheezing. She has tried rest and OTC cough suppressant for the symptoms.     Review of Systems  Constitutional:  Negative for chills and fever.  HENT:  Negative for ear pain.   Respiratory:  Positive for cough. Negative for shortness of breath and wheezing.   Musculoskeletal:  Negative for myalgias.  Neurological:  Positive for headaches.  All other systems reviewed and are negative.   Observations/Objective: No  SOB or distress noted   Assessment and Plan: 1. COVID-19 Given symptoms started over a week ago, she can not have antivirals. COVID positive, rest, force fluids, tylenol as needed, Quarantine for at least 5 days and you are fever free, then must wear a mask out in public from day 3-74, report any worsening symptoms such as increased shortness of breath, swelling, or continued high fevers. - fluticasone (FLONASE) 50 MCG/ACT nasal  spray; Place 2 sprays into both nostrils daily.  Dispense: 16 g; Refill: 6 - ondansetron (ZOFRAN) 4 MG tablet; Take 1 tablet (4 mg total) by mouth every 8 (eight) hours as needed for nausea or vomiting.  Dispense: 20 tablet; Refill: 0    I discussed the assessment and treatment plan with the patient. The patient was provided an opportunity to ask questions and all were answered. The patient agreed with the plan and demonstrated an understanding of the instructions.   The patient was advised to call back or seek an in-person evaluation if the symptoms worsen or if the condition fails to improve as anticipated.  The above assessment and management plan was discussed with the patient. The patient verbalized understanding of and has agreed to the management plan. Patient is aware to call the clinic if symptoms persist or worsen. Patient is aware when to return to the clinic for a follow-up visit. Patient educated on when it is appropriate to go to the emergency department.   Time call ended:  1:52 pm   I provided 11 minutes of  non face-to-face time during this encounter.    Evelina Dun, FNP

## 2021-05-02 NOTE — Patient Instructions (Signed)
10 Things You Can Do to Manage Your COVID-19 Symptoms at Home ?If you have possible or confirmed COVID-19 ?Stay home except to get medical care. ?Monitor your symptoms carefully. If your symptoms get worse, call your healthcare provider immediately. ?Get rest and stay hydrated. ?If you have a medical appointment, call the healthcare provider ahead of time and tell them that you have or may have COVID-19. ?For medical emergencies, call 911 and notify the dispatch personnel that you have or may have COVID-19. ?Cover your cough and sneezes with a tissue or use the inside of your elbow. ?Wash your hands often with soap and water for at least 20 seconds or clean your hands with an alcohol-based hand sanitizer that contains at least 60% alcohol. ?As much as possible, stay in a specific room and away from other people in your home. Also, you should use a separate bathroom, if available. If you need to be around other people in or outside of the home, wear a mask. ?Avoid sharing personal items with other people in your household, like dishes, towels, and bedding. ?Clean all surfaces that are touched often, like counters, tabletops, and doorknobs. Use household cleaning sprays or wipes according to the label instructions. ?cdc.gov/coronavirus ?11/24/2019 ?This information is not intended to replace advice given to you by your health care provider. Make sure you discuss any questions you have with your health care provider. ?Document Revised: 01/17/2021 Document Reviewed: 01/17/2021 ?Elsevier Patient Education ? 2022 [employer] ? ?

## 2021-05-28 ENCOUNTER — Ambulatory Visit (INDEPENDENT_AMBULATORY_CARE_PROVIDER_SITE_OTHER): Payer: BC Managed Care – PPO | Admitting: Nurse Practitioner

## 2021-05-28 ENCOUNTER — Other Ambulatory Visit: Payer: Self-pay | Admitting: Nurse Practitioner

## 2021-05-28 ENCOUNTER — Encounter: Payer: Self-pay | Admitting: Nurse Practitioner

## 2021-05-28 VITALS — BP 105/69 | HR 78 | Temp 98.3°F | Ht 66.0 in | Wt 127.1 lb

## 2021-05-28 DIAGNOSIS — J029 Acute pharyngitis, unspecified: Secondary | ICD-10-CM | POA: Diagnosis not present

## 2021-05-28 MED ORDER — AMOXICILLIN-POT CLAVULANATE 875-125 MG PO TABS: 1.0000 | ORAL_TABLET | Freq: Two times a day (BID) | ORAL | 0 refills | Status: DC

## 2021-05-28 NOTE — Patient Instructions (Signed)
Pharyngitis ?Pharyngitis is a sore throat (pharynx). This is when there is redness, pain, and swelling in your throat. Most of the time, this condition gets better on its own. In some cases, you may need medicine. ?What are the causes? ?An infection from a virus. ?An infection from bacteria. ?Allergies. ?What increases the risk? ?Being 5-28 years old. ?Being in crowded environments. These include: ?Daycares. ?Schools. ?Dormitories. ?Living in a place with cold temperatures outside. ?Having a weakened disease-fighting (immune) system. ?What are the signs or symptoms? ?Symptoms may vary depending on the cause. Common symptoms include: ?Sore throat. ?Tiredness (fatigue). ?Low-grade fever. ?Stuffy nose. ?Cough. ?Headache. ?Other symptoms may include: ?Glands in the neck (lymph nodes) that are swollen. ?Skin rashes. ?Film on the throat or tonsils. This can be caused by an infection from bacteria. ?Vomiting. ?Red, itchy eyes. ?Loss of appetite. ?Joint pain and muscle aches. ?Tonsils that are temporarily bigger than usual (enlarged). ?How is this treated? ?Many times, treatment is not needed. This condition usually gets better in 3-4 days without treatment. ?If the infection is caused by a bacteria, you may be need to take antibiotics. ?Follow these instructions at home: ?Medicines ?Take over-the-counter and prescription medicines only as told by your doctor. ?If you were prescribed an antibiotic medicine, take it as told by your doctor. Do not stop taking the antibiotic even if you start to feel better. ?Use throat lozenges or sprays to soothe your throat as told by your doctor. ?Children can get pharyngitis. Do not give your child aspirin. ?Managing pain ?To help with pain, try: ?Sipping warm liquids, such as: ?Broth. ?Herbal tea. ?Warm water. ?Eating or drinking cold or frozen liquids, such as frozen ice pops. ?Rinsing your mouth (gargle) with a salt water mixture 3-4 times a day or as needed. ?To make salt water,  dissolve ?-1 tsp (3-6 g) of salt in 1 cup (237 mL) of warm water. ?Do not swallow this mixture. ?Sucking on hard candy or throat lozenges. ?Putting a cool-mist humidifier in your bedroom at night to moisten the air. ?Sitting in the bathroom with the door closed for 5-10 minutes while you run hot water in the shower. ? ?General instructions ? ?Do not smoke or use any products that contain nicotine or tobacco. If you need help quitting, ask your doctor. ?Rest as told by your doctor. ?Drink enough fluid to keep your pee (urine) pale yellow. ?How is this prevented? ?Wash your hands often for at least 20 seconds with soap and water. If soap and water are not available, use hand sanitizer. ?Do not touch your eyes, nose, or mouth with unwashed hands. Wash hands after touching these areas. ?Do not share cups or eating utensils. ?Avoid close contact with people who are sick. ?Contact a doctor if: ?You have large, tender lumps in your neck. ?You have a rash. ?You cough up green, yellow-brown, or bloody spit. ?Get help right away if: ?You have a stiff neck. ?You drool or cannot swallow liquids. ?You cannot drink or take medicines without vomiting. ?You have very bad pain that does not go away with medicine. ?You have problems breathing, and it is not from a stuffy nose. ?You have new pain and swelling in your knees, ankles, wrists, or elbows. ?These symptoms may be an emergency. Get help right away. Call your local emergency services (911 in the [place] ?Do not wait to see if the symptoms will go away. ?Do not drive yourself to the hospital. ?Summary ?Pharyngitis is a sore throat (pharynx). This is   when there is redness, pain, and swelling in your throat. ?Most of the time, pharyngitis gets better on its own. Sometimes, you may need medicine. ?If you were prescribed an antibiotic medicine, take it as told by your doctor. Do not stop taking the antibiotic even if you start to feel better. ?This information is not intended to  replace advice given to you by your health care provider. Make sure you discuss any questions you have with your health care provider. ?Document Revised: 07/24/2020 Document Reviewed: 07/24/2020 ?Elsevier Patient Education ? 2022 [employer] Inc[employer] ? ?

## 2021-05-28 NOTE — Progress Notes (Signed)
Acute Office Visit  Subjective:    Patient ID: Joan Payne, female    DOB: [birth year], 26 y.o.   MRN: [id number]  Chief Complaint  Patient presents with   Sore Throat    Sore Throat  This is a new problem. The current episode started yesterday. The problem has been gradually worsening. The pain is worse on the left side. Maximum temperature: low grade 99.8. The fever has been present for 1 to 2 days. The pain is moderate. Associated symptoms include swollen glands and trouble swallowing. Pertinent negatives include no abdominal pain, congestion, ear pain, headaches or hoarse voice. She has had no exposure to strep. She has tried nothing for the symptoms. .  No   History reviewed. No pertinent past medical history.  Past Surgical History:  Procedure Laterality Date   HERNIA REPAIR  2000   double hernia    Family History  Problem Relation Age of Onset   Diabetes Father    Hypertension Father    Thyroid disease Father     Social History   Socioeconomic History   Marital status: Single    Spouse name: Not on file   Number of children: Not on file   Years of education: Not on file   Highest education level: Not on file  Occupational History   Not on file  Tobacco Use   Smoking status: Never   Smokeless tobacco: Never  Vaping Use   Vaping Use: Never used  Substance and Sexual Activity   Alcohol use: No   Drug use: No   Sexual activity: Not on file  Other Topics Concern   Not on file  Social History Narrative   Not on file   Social Determinants of Health   Financial Resource Strain: Not on file  Food Insecurity: Not on file  Transportation Needs: Not on file  Physical Activity: Not on file  Stress: Not on file  Social Connections: Not on file  Intimate Partner Violence: Not on file    Outpatient Medications Prior to Visit  Medication Sig Dispense Refill   fluticasone (FLONASE) 50 MCG/ACT nasal spray Place 2 sprays into both nostrils daily. 16 g 6    norethindrone-ethinyl estradiol (OVCON-35) 0.4-35 MG-MCG tablet Take 1 tablet by mouth daily.     omeprazole (PRILOSEC) 40 MG capsule Take 40 mg by mouth daily.     ondansetron (ZOFRAN) 4 MG tablet Take 1 tablet (4 mg total) by mouth every 8 (eight) hours as needed for nausea or vomiting. (Patient not taking: Reported on 05/28/2021) 20 tablet 0   blood glucose meter kit and supplies KIT Dispense based on patient and insurance preference. Use up to four times daily as directed. (FOR ICD-9 250.00, 250.01). 1 each 0   Glucose Blood (BLOOD GLUCOSE TEST STRIPS) STRP 1 strip by In Vitro route 2 (two) times daily. 60 each 11   triamcinolone cream (KENALOG) 0.1 % Apply 1 application topically 2 (two) times daily. 30 g 0   No facility-administered medications prior to visit.    No Known Allergies  Review of Systems  HENT:  Positive for trouble swallowing. Negative for congestion, ear pain and hoarse voice.   Eyes: Negative.   Respiratory: Negative.    Cardiovascular: Negative.   Gastrointestinal:  Negative for abdominal pain.  Neurological:  Negative for headaches.  All other systems reviewed and are negative.     Objective:    Physical Exam Vitals and nursing note reviewed.  Constitutional:  Appearance: Normal appearance.  HENT:     Head: Normocephalic.     Right Ear: Ear canal normal.     Left Ear: Ear canal normal.     Mouth/Throat:     Lips: Pink.     Mouth: Mucous membranes are moist.     Pharynx: Pharyngeal swelling present. No uvula swelling.  Eyes:     Conjunctiva/sclera: Conjunctivae normal.  Cardiovascular:     Rate and Rhythm: Normal rate.  Pulmonary:     Effort: Pulmonary effort is normal.     Breath sounds: Normal breath sounds.  Abdominal:     General: Bowel sounds are normal.  Skin:    General: Skin is warm.     Findings: No erythema or rash.  Neurological:     Mental Status: She is alert and oriented to person, place, and time.  Psychiatric:         Behavior: Behavior normal.    BP 105/69    Pulse 78    Temp 98.3 F (36.8 C) (Temporal)    Ht $R'5\' 6"'hB$  (1.676 m)    Wt 127 lb 2 oz (57.7 kg)    BMI 20.52 kg/m  Wt Readings from Last 3 Encounters:  05/28/21 127 lb 2 oz (57.7 kg)  04/27/18 125 lb (56.7 kg)  12/30/16 124 lb 8 oz (56.5 kg)    Health Maintenance Due  Topic Date Due   HIV Screening  Never done   Hepatitis C Screening  Never done   PAP-Cervical Cytology Screening  05/07/2018   PAP SMEAR-Modifier  05/07/2018   COVID-19 Vaccine (3 - Booster for Pfizer series) 03/25/2020    There are no preventive care reminders to display for this patient.   Lab Results  Component Value Date   TSH 2.230 12/09/2016   Lab Results  Component Value Date   WBC 4.9 12/09/2016   HGB 13.3 12/09/2016   HCT 38.7 12/09/2016   MCV 93 12/09/2016   PLT 312 12/09/2016   Lab Results  Component Value Date   NA 137 12/09/2016   K 4.4 12/09/2016   CO2 19 (L) 12/09/2016   GLUCOSE 88 12/09/2016   BUN 7 12/09/2016   CREATININE 0.82 12/09/2016   BILITOT 0.2 12/09/2016   ALKPHOS 50 12/09/2016   AST 15 12/09/2016   ALT 7 12/09/2016   PROT 7.2 12/09/2016   ALBUMIN 4.3 12/09/2016   CALCIUM 9.1 12/09/2016        Assessment & Plan:  Symptoms not well controlled in the past 2 to 3 days.  On assessment findings red, and tender.  Patient has used nothing for symptoms.  Encouraged gargle salt water, Tylenol or ibuprofen for pain.  Completed strep culture and strep swab results pending.  Patient knows to follow-up with worsening or unresolved symptoms. Problem List Items Addressed This Visit       Respiratory   Pharyngitis - Primary   Relevant Medications   amoxicillin-clavulanate (AUGMENTIN) 875-125 MG tablet   Other Relevant Orders   Culture, Group A Strep   Rapid Strep Screen ([facility] ONLY)     Meds ordered this encounter  Medications   amoxicillin-clavulanate (AUGMENTIN) 875-125 MG tablet    Sig: Take 1 tablet by mouth 2 (two)  times daily.    Dispense:  20 tablet    Refill:  0    Order Specific Question:   Supervising Provider    Answer:   Claretta Fraise [[id number]]     Ivy Lynn, NP

## 2021-05-29 ENCOUNTER — Telehealth: Payer: Self-pay | Admitting: Family

## 2021-05-29 LAB — RAPID STREP SCREEN (MED CTR MEBANE ONLY): Strep Gp A Ag, IA W/Reflex: NEGATIVE

## 2021-05-29 LAB — CULTURE, GROUP A STREP

## 2021-05-29 NOTE — Telephone Encounter (Signed)
LMOM results were negative

## 2021-05-31 LAB — CULTURE, GROUP A STREP: Strep A Culture: NEGATIVE

## 2022-01-07 ENCOUNTER — Encounter: Payer: Self-pay | Admitting: Family Medicine

## 2022-01-07 ENCOUNTER — Telehealth (INDEPENDENT_AMBULATORY_CARE_PROVIDER_SITE_OTHER): Payer: BC Managed Care – PPO | Admitting: Family Medicine

## 2022-01-07 DIAGNOSIS — J019 Acute sinusitis, unspecified: Secondary | ICD-10-CM

## 2022-01-07 MED ORDER — PREDNISONE 20 MG PO TABS: 20.0000 mg | ORAL_TABLET | Freq: Every day | ORAL | 0 refills | Status: AC

## 2022-01-07 MED ORDER — AZITHROMYCIN 250 MG PO TABS: ORAL_TABLET | ORAL | 0 refills | Status: DC

## 2022-01-07 NOTE — Progress Notes (Signed)
Virtual Visit via video Note   Due to COVID-19 pandemic this visit was conducted virtually. This visit type was conducted due to national recommendations for restrictions regarding the COVID-19 Pandemic (e.g. social distancing, sheltering in place) in an effort to limit this patient's exposure and mitigate transmission in our community. All issues noted in this document were discussed and addressed.  A physical exam was not performed with this format.  I connected with  Joan Payne  on 01/07/22 at 1152 by video and verified that I am speaking with the correct person using two identifiers. Joan Payne is currently located at work and no one is currently with her during the visit. The provider, Gwenlyn Perking, FNP is located in their office at time of visit.  I discussed the limitations, risks, security and privacy concerns of performing an evaluation and management service by video  and the availability of in person appointments. I also discussed with the patient that there may be a patient responsible charge related to this service. The patient expressed understanding and agreed to proceed.  CC: sinusitis  History and Present Illness: Sinusitis Episode onset: 13 days. The problem is unchanged. There has been no fever. Associated symptoms include congestion, coughing, headaches, a hoarse voice, sinus pressure, sneezing and a sore throat. Pertinent negatives include no chills, diaphoresis, ear pain, neck pain, shortness of breath or swollen glands. Past treatments include acetaminophen, saline nose sprays, oral decongestants and nasal decongestants. The treatment provided mild relief.  She has had 3 negative Covid tests. Reprots that zpak has worked well for her in the past. She reports significant nausea with augmentin.    Review of Systems  Constitutional:  Negative for chills and diaphoresis.  HENT:  Positive for congestion, hoarse voice, sinus pressure, sneezing and sore throat.  Negative for ear pain.   Respiratory:  Positive for cough. Negative for shortness of breath.   Musculoskeletal:  Negative for neck pain.  Neurological:  Positive for headaches.    Observations/Objective: Alert and oriented x 3. Able to speak in full sentences without difficulty. Respirations unlabored. No cyanosis. Hoarseness noted in voice.   Assessment and Plan: Diagnoses and all orders for this visit:  Acute non-recurrent sinusitis, unspecified location Symptoms x 13 days without improvement. Zpak as below. Prednisone burst as below. Discussed symptomatic care and return precautions.  -     azithromycin (ZITHROMAX Z-PAK) 250 MG tablet; As directed -     predniSONE (DELTASONE) 20 MG tablet; Take 1 tablet (20 mg total) by mouth daily with breakfast for 3 days.     Follow Up Instructions: As needed.     I discussed the assessment and treatment plan with the patient. The patient was provided an opportunity to ask questions and all were answered. The patient agreed with the plan and demonstrated an understanding of the instructions.   The patient was advised to call back or seek an in-person evaluation if the symptoms worsen or if the condition fails to improve as anticipated.  The above assessment and management plan was discussed with the patient. The patient verbalized understanding of and has agreed to the management plan. Patient is aware to call the clinic if symptoms persist or worsen. Patient is aware when to return to the clinic for a follow-up visit. Patient educated on when it is appropriate to go to the emergency department.   Time call ended: 1203  I provided 11 minutes of face-to-face time during this encounter.    Gwenlyn Perking,  FNP

## 2022-06-16 ENCOUNTER — Ambulatory Visit: Payer: BC Managed Care – PPO | Admitting: Family Medicine

## 2022-07-10 ENCOUNTER — Telehealth: Payer: Self-pay | Admitting: Physician Assistant

## 2022-07-10 DIAGNOSIS — B9789 Other viral agents as the cause of diseases classified elsewhere: Secondary | ICD-10-CM

## 2022-07-10 DIAGNOSIS — J019 Acute sinusitis, unspecified: Secondary | ICD-10-CM

## 2022-07-10 MED ORDER — AZELASTINE HCL 0.1 % NA SOLN: 1.0000 | Freq: Two times a day (BID) | NASAL | 0 refills | Status: AC

## 2022-07-10 NOTE — Progress Notes (Signed)
E-Visit for Sinus Problems  We are sorry that you are not feeling well.  Here is how we plan to help!  Based on what you have shared with me it looks like you have sinusitis.  Sinusitis is inflammation and infection in the sinus cavities of the head.  Based on your presentation I believe you most likely have Acute Viral Sinusitis.This is an infection most likely caused by a virus. There is not specific treatment for viral sinusitis other than to help you with the symptoms until the infection runs its course.  You may use an oral decongestant such as Mucinex D or if you have glaucoma or high blood pressure use plain Mucinex. Saline nasal spray help and can safely be used as often as needed for congestion, I have prescribed: Azelastine nasal spray 2 sprays in each nostril twice a day; Can use together with Flonase (Fluticasone). If you have not been using the flonase would recommend to start this as well.   Some authorities believe that zinc sprays or the use of Echinacea may shorten the course of your symptoms.  Sinus infections are not as easily transmitted as other respiratory infection, however we still recommend that you avoid close contact with loved ones, especially the very young and elderly.  Remember to wash your hands thoroughly throughout the day as this is the number one way to prevent the spread of infection!  Home Care: Only take medications as instructed by your medical team. Do not take these medications with alcohol. A steam or ultrasonic humidifier can help congestion.  You can place a towel over your head and breathe in the steam from hot water coming from a faucet. Avoid close contacts especially the very young and the elderly. Cover your mouth when you cough or sneeze. Always remember to wash your hands.  Get Help Right Away If: You develop worsening fever or sinus pain. You develop a severe head ache or visual changes. Your symptoms persist after you have completed your  treatment plan.  Make sure you Understand these instructions. Will watch your condition. Will get help right away if you are not doing well or get worse.   Thank you for choosing an e-visit.  Your e-visit answers were reviewed by a board certified advanced clinical practitioner to complete your personal care plan. Depending upon the condition, your plan could have included both over the counter or prescription medications.  Please review your pharmacy choice. Make sure the pharmacy is open so you can pick up prescription now. If there is a problem, you may contact your provider through [employer] and have the prescription routed to another pharmacy.  Your safety is important to [place]. If you have drug allergies check your prescription carefully.   For the next 24 hours you can use MyChart to ask questions about today's visit, request a non-urgent call back, or ask for a work or school excuse. You will get an email in the next two days asking about your experience. I hope that your e-visit has been valuable and will speed your recovery.  I have spent 5 minutes in review of e-visit questionnaire, review and updating patient chart, medical decision making and response to patient.   Mar Daring, PA-C

## 2022-07-24 ENCOUNTER — Telehealth: Payer: Self-pay | Admitting: Family Medicine

## 2022-07-24 DIAGNOSIS — J019 Acute sinusitis, unspecified: Secondary | ICD-10-CM

## 2022-07-24 DIAGNOSIS — B9689 Other specified bacterial agents as the cause of diseases classified elsewhere: Secondary | ICD-10-CM

## 2022-07-24 MED ORDER — AMOXICILLIN-POT CLAVULANATE 875-125 MG PO TABS: 1.0000 | ORAL_TABLET | Freq: Two times a day (BID) | ORAL | 0 refills | Status: DC

## 2022-07-24 MED ORDER — DOXYCYCLINE HYCLATE 100 MG PO TABS: 100.0000 mg | ORAL_TABLET | Freq: Two times a day (BID) | ORAL | 0 refills | Status: AC

## 2022-07-24 NOTE — Addendum Note (Signed)
Addended by: Perlie Mayo on: 07/24/2022 08:29 AM   Modules accepted: Orders

## 2022-07-24 NOTE — Progress Notes (Signed)

## 2022-07-27 ENCOUNTER — Telehealth: Payer: Self-pay | Admitting: Family

## 2022-07-27 ENCOUNTER — Telehealth: Payer: BC Managed Care – PPO

## 2022-07-27 NOTE — Telephone Encounter (Signed)
Called and spoke to pt she has had 2 virtual and no better this has been going on for weeks, she had another virtual scheduled but wanted to be worked oin today did not want to wait for tomorrow so will go to an urgent care

## 2022-11-27 ENCOUNTER — Ambulatory Visit: Payer: BC Managed Care – PPO

## 2023-02-15 ENCOUNTER — Telehealth: Payer: BC Managed Care – PPO | Admitting: Family Medicine

## 2023-02-15 ENCOUNTER — Encounter: Payer: Self-pay | Admitting: Family Medicine

## 2023-02-15 ENCOUNTER — Other Ambulatory Visit: Payer: Self-pay | Admitting: Family Medicine

## 2023-02-15 DIAGNOSIS — H1031 Unspecified acute conjunctivitis, right eye: Secondary | ICD-10-CM

## 2023-02-15 MED ORDER — NEOMYCIN-POLYMYXIN-HC 3.5-10000-1 OP SUSP: 3.0000 [drp] | Freq: Four times a day (QID) | OPHTHALMIC | 0 refills | Status: DC

## 2023-02-15 NOTE — Progress Notes (Signed)
Virtual Visit via MyChart video note  I connected with Joan Payne on 02/15/23 at 1609 by video and verified that I am speaking with the correct person using two identifiers. Joan Payne is currently located at home and patient are currently with her during visit. The provider, Elige Radon Aeric Burnham, MD is located in their office at time of visit.  Call ended at 1617  I discussed the limitations, risks, security and privacy concerns of performing an evaluation and management service by video and the availability of in person appointments. I also discussed with the patient that there may be a patient responsible charge related to this service. The patient expressed understanding and agreed to proceed.   History and Present Illness: Right eye red and discharge and started yesterday. She did have yellow discharge. She said it has been there for 2 days.  She did some some clear eyes drops. Her eye is itchy but no vision changes. She gets some seasonal allergies but not right now. She denies sick contacts that she knows of but she is a Runner, broadcasting/film/video.   1. Acute conjunctivitis of right eye, unspecified acute conjunctivitis type     Outpatient Encounter Medications as of 02/15/2023  Medication Sig   neomycin-polymyxin-hydrocortisone (CORTISPORIN) 3.5-10000-1 ophthalmic suspension Place 3 drops into the right eye 4 (four) times daily for 7 days.   azelastine (ASTELIN) 0.1 % nasal spray Place 1 spray into both nostrils 2 (two) times daily. Use in each nostril as directed   fluticasone (FLONASE) 50 MCG/ACT nasal spray Place 2 sprays into both nostrils daily.   norethindrone-ethinyl estradiol (OVCON-35) 0.4-35 MG-MCG tablet Take 1 tablet by mouth daily.   omeprazole (PRILOSEC) 40 MG capsule Take 40 mg by mouth daily.   ondansetron (ZOFRAN) 4 MG tablet Take 1 tablet (4 mg total) by mouth every 8 (eight) hours as needed for nausea or vomiting. (Patient not taking: Reported on 05/28/2021)   No  facility-administered encounter medications on file as of 02/15/2023.    Review of Systems  Constitutional:  Negative for chills and fever.  HENT:  Negative for congestion, ear discharge and ear pain.   Eyes:  Positive for discharge, redness and itching. Negative for visual disturbance.  Respiratory:  Negative for chest tightness and shortness of breath.   Cardiovascular:  Negative for chest pain and leg swelling.  Genitourinary:  Negative for difficulty urinating and dysuria.  Musculoskeletal:  Negative for back pain and gait problem.  Skin:  Negative for rash.  Neurological:  Negative for light-headedness and headaches.  Psychiatric/Behavioral:  Negative for agitation and behavioral problems.   All other systems reviewed and are negative.   Observations/Objective: Patient sounds comfortable and in no acute distress  Assessment and Plan: Problem List Items Addressed This Visit   None Visit Diagnoses     Acute conjunctivitis of right eye, unspecified acute conjunctivitis type    -  Primary   Relevant Medications   neomycin-polymyxin-hydrocortisone (CORTISPORIN) 3.5-10000-1 ophthalmic suspension       Will do some antibiotic eyedrops, likely some early conjunctivitis in the right eye, do not see any in the left eye. Follow up plan: Return if symptoms worsen or fail to improve.     I discussed the assessment and treatment plan with the patient. The patient was provided an opportunity to ask questions and all were answered. The patient agreed with the plan and demonstrated an understanding of the instructions.   The patient was advised to call back or seek an in-person evaluation  if the symptoms worsen or if the condition fails to improve as anticipated.  The above assessment and management plan was discussed with the patient. The patient verbalized understanding of and has agreed to the management plan. Patient is aware to call the clinic if symptoms persist or worsen. Patient  is aware when to return to the clinic for a follow-up visit. Patient educated on when it is appropriate to go to the emergency department.    I provided 8 minutes of non-face-to-face time during this encounter.    Nils Pyle, MD

## 2023-02-16 NOTE — Telephone Encounter (Signed)
dexamethasone (DECADRON) 0.1 % ophthalmic solution        Changed from: neomycin-polymyxin-hydrocortisone (CORTISPORIN) 3.5-10000-1 ophthalmic    Pharmacy comment: Product Backordered/Unavailable:PRESCRIBED PRODUCT NOT IN STOCK. PLEASE CONSIDER THE COST-EFFECTIVE POTENTIAL ALTERNATIVE(S) LISTED AND EVALUATE IF APPROPRIATE FOR YOUR PATIENT'S INDICATION AND TREATMENT GOALS.   All Pharmacy Suggested Alternatives:   dexamethasone (DECADRON) 0.1 % ophthalmic solution gentamicin (GENTAK) 0.3 % ophthalmic ointment neomycin-polymyxin b-dexamethasone (MAXITROL) 3.5-10000-0.1 SUSP

## 2023-02-17 ENCOUNTER — Telehealth: Payer: Self-pay | Admitting: Family

## 2023-02-17 NOTE — Telephone Encounter (Signed)
Please review and advise.

## 2023-02-18 MED ORDER — POLYMYXIN B-TRIMETHOPRIM 10000-0.1 UNIT/ML-% OP SOLN: 1.0000 [drp] | OPHTHALMIC | 0 refills | Status: AC

## 2023-02-18 NOTE — Telephone Encounter (Signed)
Sent a different eyedrop for the patient.
